# Patient Record
Sex: Male | Born: 2005 | Race: White | Hispanic: No | Marital: Single | Smoking: Never smoker
Health system: Southern US, Community
[De-identification: ages and names within clinical notes are randomized; demographics above are authoritative.]

## PROBLEM LIST (undated history)

## (undated) ENCOUNTER — Ambulatory Visit

## (undated) ENCOUNTER — Encounter

## (undated) ENCOUNTER — Telehealth

## (undated) ENCOUNTER — Ambulatory Visit: Payer: MEDICAID

## (undated) ENCOUNTER — Ambulatory Visit: Payer: PRIVATE HEALTH INSURANCE

## (undated) ENCOUNTER — Telehealth: Attending: Ophthalmology | Primary: Ophthalmology

## (undated) DIAGNOSIS — J45909 Unspecified asthma, uncomplicated: Secondary | ICD-10-CM

## (undated) DIAGNOSIS — F419 Anxiety disorder, unspecified: Secondary | ICD-10-CM

## (undated) DIAGNOSIS — F909 Attention-deficit hyperactivity disorder, unspecified type: Secondary | ICD-10-CM

## (undated) DIAGNOSIS — F429 Obsessive-compulsive disorder, unspecified: Secondary | ICD-10-CM

---
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
## 2023-01-09 ENCOUNTER — Telehealth (INDEPENDENT_AMBULATORY_CARE_PROVIDER_SITE_OTHER): Payer: Self-pay

## 2023-01-09 NOTE — Telephone Encounter (Signed)
Fax received for refill  Last refill rx written 11/21/2022 Last OV 04/2022 Returned fax with note will need to send request to PCP for refills- Lavell Luster NP is retired and no providers are at that practice

## 2023-01-18 ENCOUNTER — Emergency Department (HOSPITAL_COMMUNITY): Payer: Medicaid Other

## 2023-01-18 ENCOUNTER — Encounter (HOSPITAL_COMMUNITY): Payer: Self-pay

## 2023-01-18 ENCOUNTER — Emergency Department (HOSPITAL_COMMUNITY)
Admission: EM | Admit: 2023-01-18 | Discharge: 2023-01-18 | Disposition: A | Discharge status: Home / Self Care | Payer: Medicaid Other | Attending: Emergency Medicine | Admitting: Emergency Medicine

## 2023-01-18 DIAGNOSIS — Y9367 Activity, basketball: Secondary | ICD-10-CM | POA: Diagnosis not present

## 2023-01-18 DIAGNOSIS — R6884 Jaw pain: Secondary | ICD-10-CM | POA: Diagnosis present

## 2023-01-18 DIAGNOSIS — W500XXA Accidental hit or strike by another person, initial encounter: Secondary | ICD-10-CM | POA: Insufficient documentation

## 2023-01-18 DIAGNOSIS — Z91010 Allergy to peanuts: Secondary | ICD-10-CM | POA: Diagnosis not present

## 2023-01-18 MED ORDER — IBUPROFEN 400 MG PO TABS
600.0000 mg | ORAL_TABLET | Freq: Once | ORAL | Status: AC
  Administered 2023-01-18: 600 mg via ORAL
  Filled 2023-01-18: qty 1

## 2023-01-18 NOTE — Discharge Instructions (Signed)
Please make an appointment with your primary care provider to be seen regarding recent ER visit and symptoms.  Today your physical exam did not show any need for x-rays however this may change which is why I want you to be reevaluated.  Continue to ice your jaw and alternate every 6 hours as needed for pain between Tylenol and ibuprofen.  If symptoms worsen please return to ER.

## 2023-01-18 NOTE — ED Triage Notes (Signed)
Pt states that he messed up his jaw playing basketball today. Reports being hit on the left side of his jaw but reports pain to his right jaw as well.

## 2023-01-18 NOTE — ED Provider Notes (Signed)
3:24 PM Signout from [employer] at shift change awaiting results of [employer].  This was personally reviewed and negative.  Patient updated on results.  Encouraged use of Tylenol/ibuprofen, ice.  Encourage follow-up with dentist.  His current complaint is clicking of the right TMJ area.  Patient assured no signs of fracture or dislocation.  Encouraged to follow-up in 1 to 2 weeks if this continues.  BP 134/70   Pulse 81   Temp 97.6 F (36.4 C)   Resp 18   SpO2 100%     Nicholas Cater, PA-C 01/18/23 [place], Kenneth, DO 01/18/23 2349
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
## 2023-01-27 NOTE — Progress Notes (Deleted)
  [facility] [address] [place] [address] Phone: [phone] Subjective:    I'm seeing this patient by the request  of:  [name], MD  CC:   [id number]  Nicholas Graves is a 17 y.o. male coming in with complaint of back and neck pain. OMT 12/11/2022. Patient states   Medications patient has been prescribed: None  Taking:         Reviewed prior external information including notes and imaging from previsou exam, outside providers and external EMR if available.   As well as notes that were available from care everywhere and other healthcare systems.  Past medical history, social, surgical and family history all reviewed in electronic medical record.  No pertanent information unless stated regarding to the chief complaint.   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 POSITIVE muscle aches  Objective  There were no vitals taken for this visit.   General: No apparent distress alert and oriented x3 mood and affect normal, dressed appropriately.  HEENT: Pupils equal, extraocular movements intact  Respiratory: Patient's speak in full sentences and does not appear short of breath  Cardiovascular: No lower extremity edema, non tender, no erythema  Gait MSK:  Back   Osteopathic findings  C2 flexed rotated and side bent right C6 flexed rotated and side bent left T3 extended rotated and side bent right  inhaled rib T9 extended rotated and side bent left L2 flexed rotated and side bent right Sacrum right on right       Assessment and Plan:  No problem-specific Assessment & Plan notes found for this encounter.    Nonallopathic problems  Decision today to treat with OMT was based on Physical Exam  After verbal consent patient was treated with HVLA, ME, FPR techniques in cervical, rib, thoracic, lumbar, and sacral  areas  Patient tolerated the procedure well with improvement in symptoms  Patient given exercises, stretches and lifestyle modifications  See medications in patient instructions if given  Patient will follow up in 4-8 weeks             Note: This dictation was prepared with Dragon dictation along with smaller phrase technology. Any transcriptional errors that result from this process are unintentional.

## 2023-01-28 ENCOUNTER — Encounter: Payer: Medicaid Other | Admitting: Pediatrics

## 2023-01-29 ENCOUNTER — Ambulatory Visit: Payer: Medicaid Other | Admitting: Family Medicine

## 2023-12-05 IMAGING — DX DG LUMBAR SPINE BEND(FLEX/EXT) ONLY 2-3 V
2 series · 2 of 2 positions shown · non-contrast
Comparison: None.

CLINICAL DATA: Low back pain for 1 year, no known injury, initial
encounter

EXAM:
LUMBAR SPINE FLEX AND EXTEND ONLY - 2 VIEW

[l-spine flex]
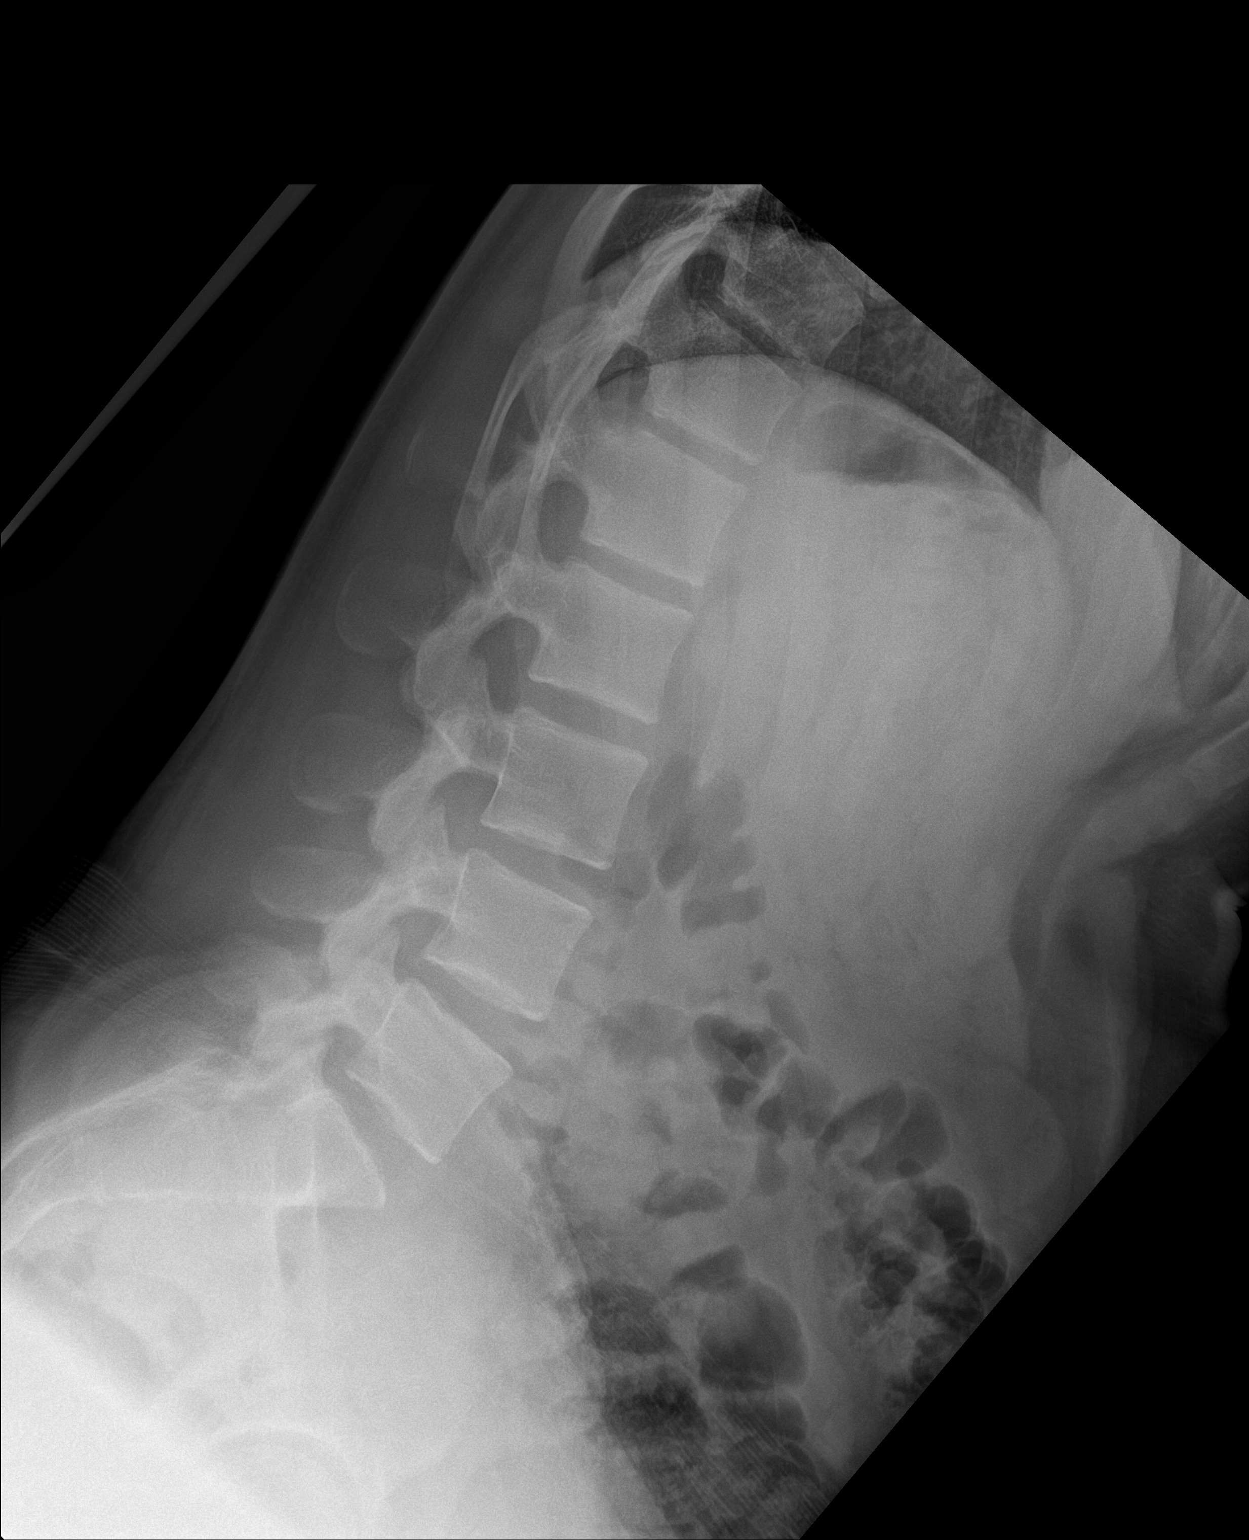

[l-spine ext]
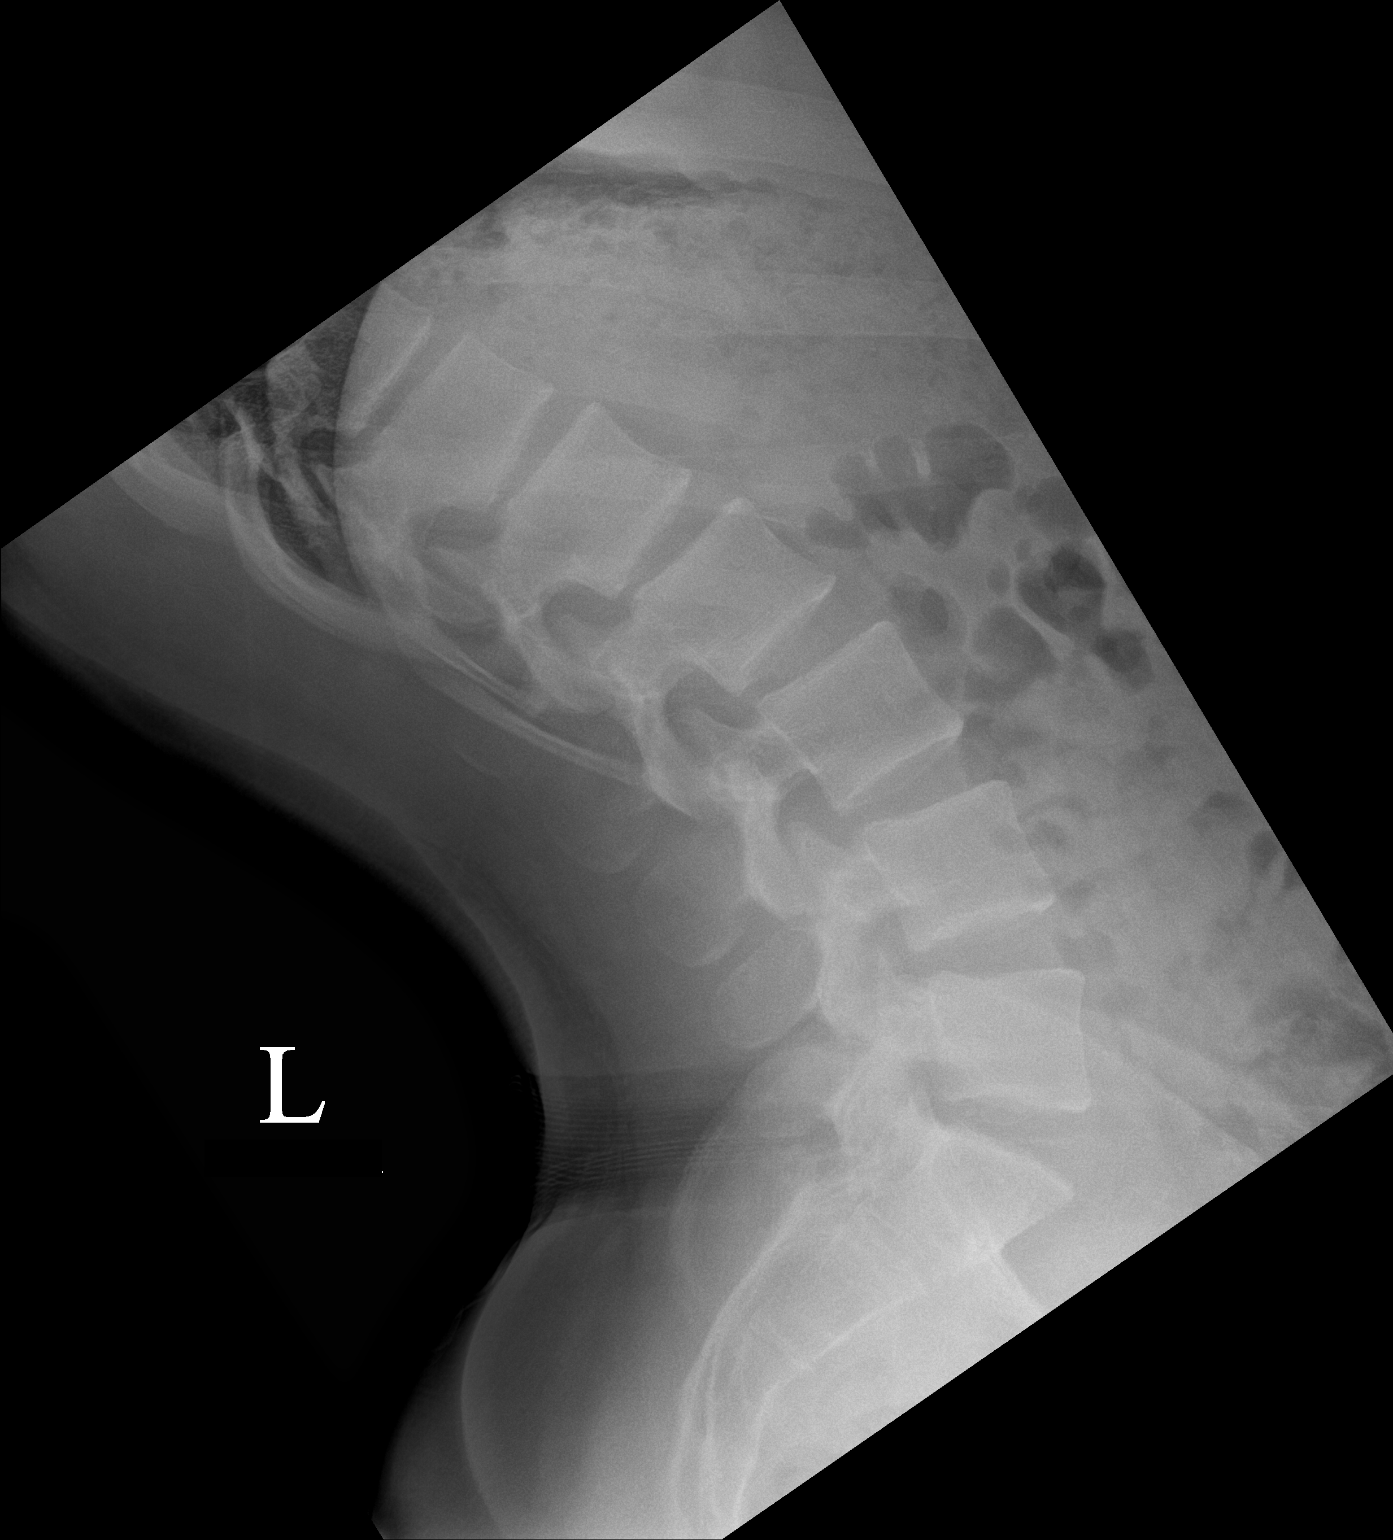

[2 of 2 positions shown; findings below may reference images not displayed]

FINDINGS: Flexion and extension views of the lumbar spine were obtained and
reveal vertebral body height to be within normal limits with the
exception of very mild wedging anteriorly at T12. No prior exams
available for comparison. No other focal abnormality is seen.
IMPRESSION: Mild height loss at T12 of uncertain significance. Correlate to
point tenderness.
